# Patient Record
Sex: Male | Born: 2005 | Race: Black or African American | Hispanic: No | Marital: Single | Smoking: Never smoker
Health system: Southern US, Community
[De-identification: ages and names within clinical notes are randomized; demographics above are authoritative.]

## PROBLEM LIST (undated history)

## (undated) DIAGNOSIS — J45909 Unspecified asthma, uncomplicated: Secondary | ICD-10-CM

---
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
## 2024-02-13 ENCOUNTER — Ambulatory Visit: Payer: Self-pay

## 2024-02-26 ENCOUNTER — Ambulatory Visit
Admission: RE | Admit: 2024-02-26 | Discharge: 2024-02-26 | Disposition: A | Discharge status: Home / Self Care | Attending: Family Medicine | Admitting: Family Medicine

## 2024-02-26 VITALS — BP 129/74 | HR 80 | Temp 98.5°F | Resp 18

## 2024-02-26 DIAGNOSIS — Z113 Encounter for screening for infections with a predominantly sexual mode of transmission: Secondary | ICD-10-CM | POA: Diagnosis present
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
## 2024-02-26 NOTE — ED Provider Notes (Signed)
 [facility]    CSN: [id number] Arrival date & time: 02/26/24  9041      History   Chief Complaint Chief Complaint  Patient presents with   Exposure to STD    Just need a re test I got negative results in may frm my doctor they released me tho so I'm just tryna make sure I'm still healthy - Entered by patient    HPI WILMAN TUCKER is a 18 y.o. male.   Patient presenting today requesting STD screening.  Denies any current symptoms or new exposures that he is aware of.  Last screening was done in May per patient with all negative results.    Past Medical History:  Diagnosis Date   Asthma     Patient Active Problem List   Diagnosis Date Noted   Asthma 02/19/2015   Allergic rhinoconjunctivitis 02/19/2015    History reviewed. No pertinent surgical history.     Home Medications    Prior to Admission medications   Medication Sig Start Date End Date Taking? Authorizing Provider  acetaminophen (TYLENOL) 160 MG/5ML liquid Take 15 mg/kg by mouth every 6 (six) hours as needed for fever. Patients mom stated she gave 30 ml q6h    [provider]  albuterol (PROVENTIL HFA;VENTOLIN HFA) 108 (90 BASE) MCG/ACT inhaler Inhale 1-2 puffs into the lungs every 6 (six) hours as needed (asthma). For shortness of breath     [provider]  beclomethasone (QVAR) 40 MCG/ACT inhaler Inhale 1 puff into the lungs every morning.     [provider]  loratadine (CLARITIN) 10 MG tablet Take 10 mg by mouth daily.    [provider]  montelukast (SINGULAIR) 5 MG chewable tablet Chew 5 mg by mouth at bedtime.    [provider]    Family History History reviewed. No pertinent family history.  Social History Social History   Tobacco Use   Smoking status: Never   Smokeless tobacco: Never  Substance Use Topics   Alcohol use: No   Drug use: No     Allergies   Patient has no known allergies.   Review of Systems Review of  Systems Per HPI  Physical Exam Triage Vital Signs ED Triage Vitals [02/26/24 1021]  Encounter Vitals Group     BP 129/74     Girls Systolic BP Percentile      Girls Diastolic BP Percentile      Boys Systolic BP Percentile      Boys Diastolic BP Percentile      Pulse Rate 80     Resp 18     Temp 98.5 F (36.9 C)     Temp Source Oral     SpO2 98 %     Weight      Height      Head Circumference      Peak Flow      Pain Score      Pain Loc      Pain Education      Exclude from Growth Chart    No data found.  Updated Vital Signs BP 129/74 (BP Location: Right Arm)   Pulse 80   Temp 98.5 F (36.9 C) (Oral)   Resp 18   SpO2 98%   Visual Acuity Right Eye Distance:   Left Eye Distance:   Bilateral Distance:    Right Eye Near:   Left Eye Near:    Bilateral Near:     Physical Exam Vitals and  nursing note reviewed.  Constitutional:      Appearance: Normal appearance.  HENT:     Head: Atraumatic.  Eyes:     Extraocular Movements: Extraocular movements intact.     Conjunctiva/sclera: Conjunctivae normal.  Cardiovascular:     Rate and Rhythm: Normal rate.  Pulmonary:     Effort: Pulmonary effort is normal.  Musculoskeletal:        General: Normal range of motion.     Cervical back: Normal range of motion and neck supple.  Skin:    General: Skin is warm and dry.  Neurological:     Mental Status: He is oriented to person, place, and time.  Psychiatric:        Mood and Affect: Mood normal.        Thought Content: Thought content normal.        Judgment: Judgment normal.      UC Treatments / Results  Labs (all labs ordered are listed, but only abnormal results are displayed) Labs Reviewed  RPR  HIV ANTIBODY (ROUTINE TESTING W REFLEX)  CYTOLOGY, (ORAL, ANAL, URETHRAL) ANCILLARY ONLY    EKG   Radiology No results found.  Procedures Procedures (including critical care time)  Medications Ordered in UC Medications - No data to display  Initial  Impression / Assessment and Plan / UC Course  I have reviewed the triage vital signs and the nursing notes.  Pertinent labs & imaging results that were available during my care of the patient were reviewed by me and considered in my medical decision making (see chart for details).     Cytology swab and HIV and syphilis labs pending, treat based on results.  Final Clinical Impressions(s) / UC Diagnoses   Final diagnoses:  Screening examination for STI   Discharge Instructions   None    ED Prescriptions   None    PDMP not reviewed this encounter.   Stuart Vernell Norris, PA-C 02/26/24 1049

## 2024-02-27 LAB — HIV ANTIBODY (ROUTINE TESTING W REFLEX): HIV Screen 4th Generation wRfx: NONREACTIVE

## 2024-02-27 LAB — RPR: RPR Ser Ql: NONREACTIVE

## 2024-02-27 LAB — CYTOLOGY, (ORAL, ANAL, URETHRAL) ANCILLARY ONLY
Chlamydia: NEGATIVE
Comment: NEGATIVE
Comment: NEGATIVE
Comment: NORMAL
Neisseria Gonorrhea: NEGATIVE
Trichomonas: NEGATIVE

## 2024-04-21 ENCOUNTER — Ambulatory Visit: Payer: Self-pay | Admitting: Nurse Practitioner
